# Patient Record
Sex: Female | Born: 1975 | Race: White | Hispanic: No | Marital: Married
Health system: Southern US, Community
[De-identification: ages and names within clinical notes are randomized; demographics above are authoritative.]

## PROBLEM LIST (undated history)

## (undated) DIAGNOSIS — Z87898 Personal history of other specified conditions: Secondary | ICD-10-CM

## (undated) HISTORY — DX: Personal history of other specified conditions: Z87.898

---

## 2005-05-25 ENCOUNTER — Inpatient Hospital Stay: Payer: Self-pay | Admitting: Certified Nurse Midwife

## 2005-05-27 ENCOUNTER — Observation Stay: Payer: Self-pay

## 2005-06-15 ENCOUNTER — Observation Stay: Payer: Self-pay | Admitting: Obstetrics and Gynecology

## 2005-07-04 ENCOUNTER — Observation Stay: Payer: Self-pay | Admitting: Obstetrics and Gynecology

## 2005-07-22 ENCOUNTER — Inpatient Hospital Stay: Payer: Self-pay | Admitting: Obstetrics and Gynecology

## 2006-08-18 ENCOUNTER — Ambulatory Visit: Payer: Self-pay | Admitting: Obstetrics and Gynecology

## 2006-10-19 ENCOUNTER — Observation Stay: Payer: Self-pay | Admitting: Obstetrics and Gynecology

## 2006-11-01 ENCOUNTER — Inpatient Hospital Stay: Payer: Self-pay | Admitting: Obstetrics and Gynecology

## 2008-05-08 ENCOUNTER — Inpatient Hospital Stay: Payer: Self-pay

## 2008-09-24 ENCOUNTER — Emergency Department: Payer: Self-pay | Admitting: Unknown Physician Specialty

## 2009-02-03 ENCOUNTER — Emergency Department: Payer: Self-pay | Admitting: Emergency Medicine

## 2009-02-12 ENCOUNTER — Ambulatory Visit: Payer: Self-pay | Admitting: Obstetrics and Gynecology

## 2009-02-16 ENCOUNTER — Ambulatory Visit: Payer: Self-pay | Admitting: Obstetrics and Gynecology

## 2009-03-09 ENCOUNTER — Ambulatory Visit: Payer: Self-pay | Admitting: Obstetrics and Gynecology

## 2009-03-19 ENCOUNTER — Ambulatory Visit: Payer: Self-pay | Admitting: Obstetrics and Gynecology

## 2009-11-17 ENCOUNTER — Ambulatory Visit: Payer: Self-pay | Admitting: Internal Medicine

## 2010-07-23 ENCOUNTER — Ambulatory Visit: Payer: Self-pay | Admitting: Obstetrics and Gynecology

## 2010-07-26 ENCOUNTER — Ambulatory Visit: Payer: Self-pay | Admitting: Obstetrics and Gynecology

## 2010-07-29 LAB — PATHOLOGY REPORT

## 2010-10-24 IMAGING — US US PELV - US TRANSVAGINAL
1 series · 17 of 25 positions shown · non-contrast
Comparison: none

REASON FOR EXAM: RLQ pain
COMMENTS:   LMP: Post Partum

[Series 1: us pelv - us transvaginal · 17 of 70 slices shown]
[im 1/70]
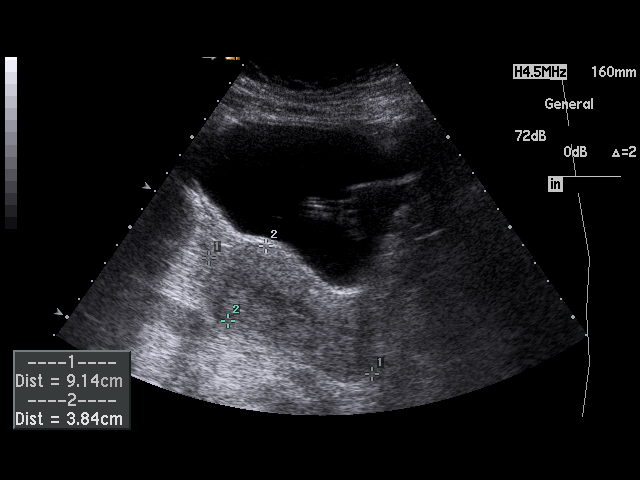
[im 6/70]
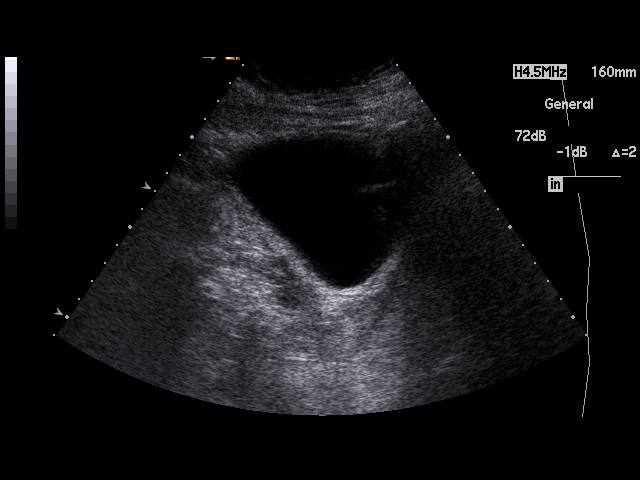
[im 9/70]
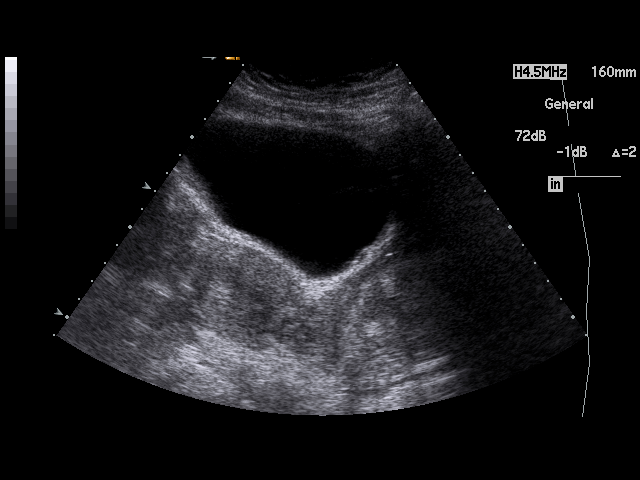
[im 15/70]
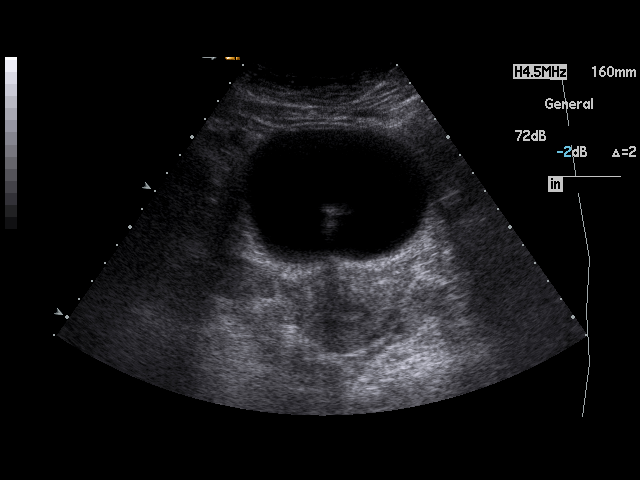
[im 18/70]
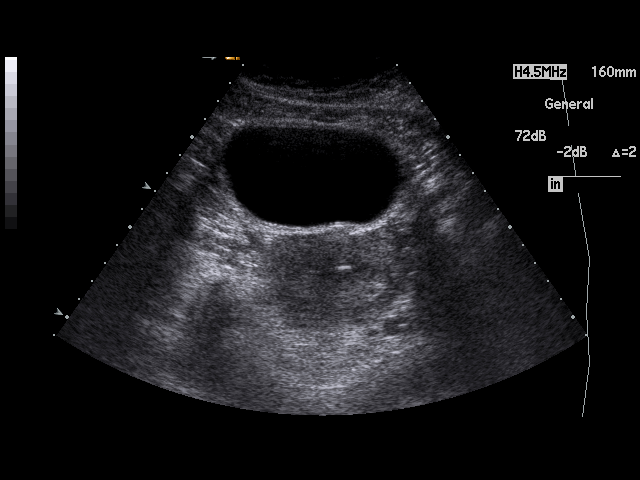
[im 24/70]
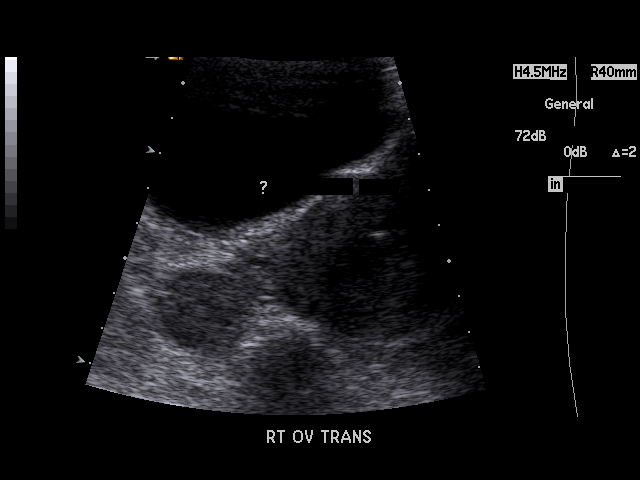
[im 26/70]
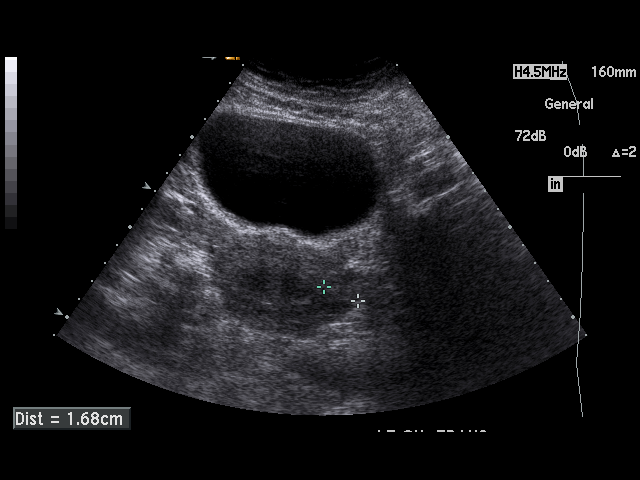
[im 32/70]
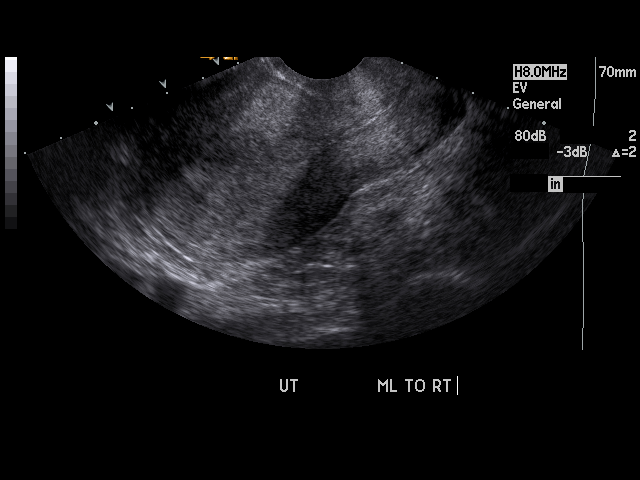
[im 35/70]
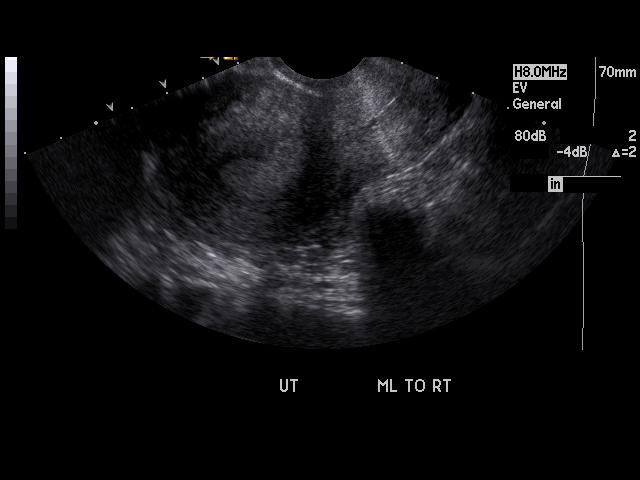
[im 38/70]
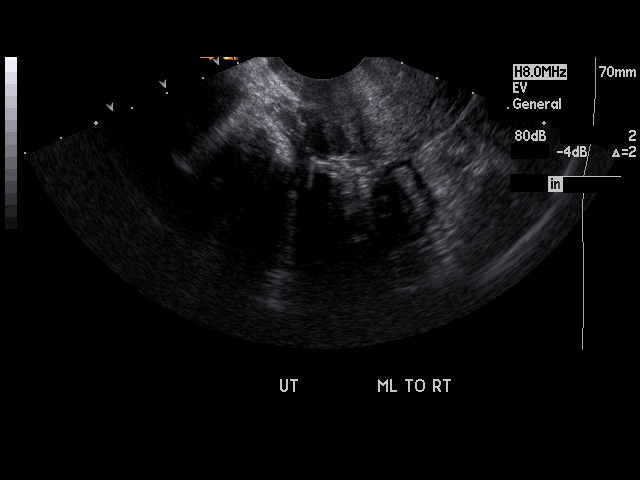
[im 44/70]
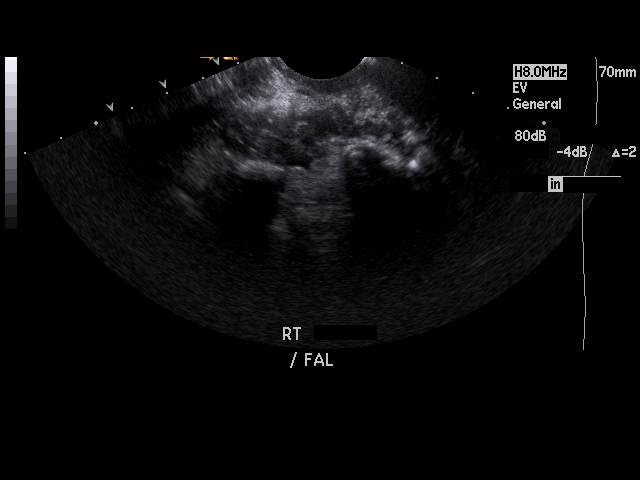
[im 47/70]
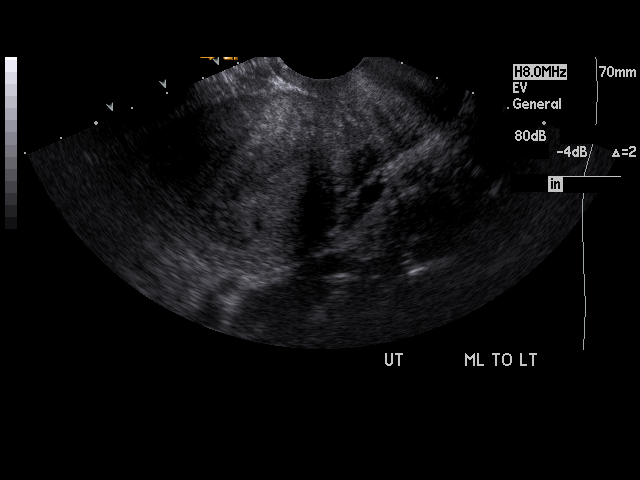
[im 52/70]
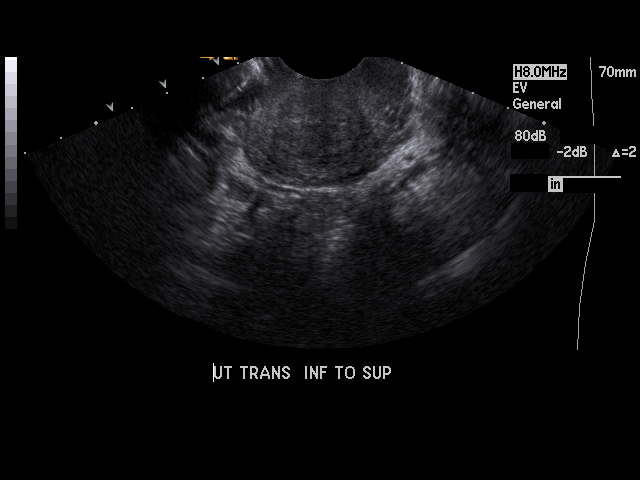
[im 55/70]
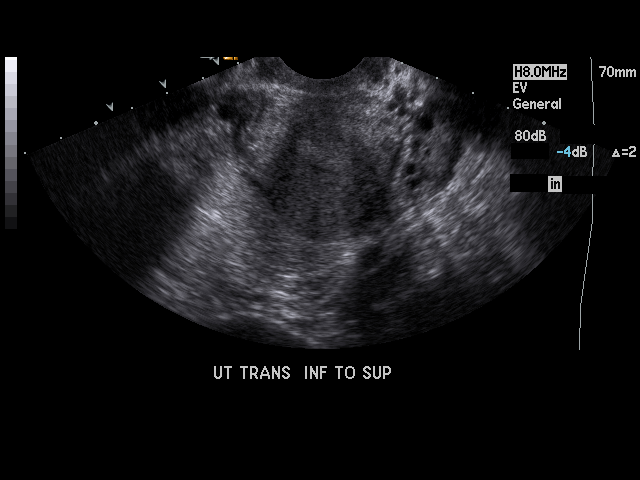
[im 61/70]
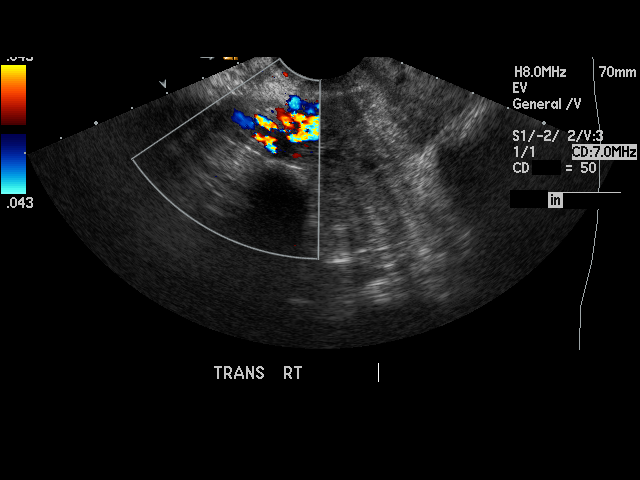
[im 64/70]
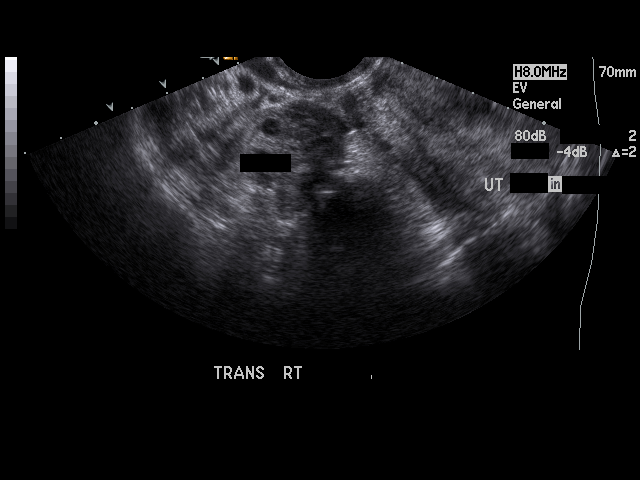
[im 70/70]
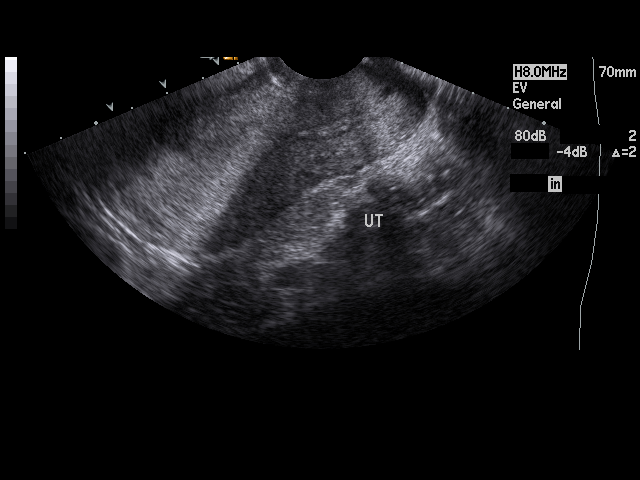

[17 of 25 positions shown; findings below may reference images not displayed]

PROCEDURE:     US  - US PELVIS EXAM W/TRANSVAGINAL  - February 03, 2009  [DATE]

RESULT:     The uterus measures 9.1 x 3.8 x 5.3 cm. In the uterine fundus
adjacent to the endometrium there is an approximately 3 mm diameter anechoic
structure that appears cystic. The endometrial stripe measures 1.4 cm.
 The right ovary measures 3.1 x 2.1 x 1.9 cm. The left ovary measures 2.5 x
1.4 x 1.2 cm. The right fallopian tube is minimally prominent. No discrete
abnormal appearing mass is noted in the right adnexal region to correspond
with the CT findings. There is a small amount of free fluid in the
cul-de-sac.
IMPRESSION: 1. In the right adnexal region there may be minimal prominence of the
fallopian tube. There is no discrete cystic or solid adnexal mass is
demonstrated however. Does the findings seen on the CT scan are felt to be
relatively nonspecific. Correlation with the patient's clinical examination,
beta-hCG, and white blood cell count is recommended. The findings could be
related to an ectopic pregnancy or to a tubo-ovarian abscess or may merely
reflect a mild hydrosalpinx.
2. The endometrial stripe is thickened. Reportedly the patient has not had a
menstrual period since beginning breast-feeding 8 months ago. Reportedly the
patient discontinued breast-feeding one month ago. Again, a beta-hCG
determination is recommended.

## 2010-10-24 IMAGING — CT CT ABD-PELV W/ CM
1 of 2 series · 14 of 32 positions shown, 18 images · non-contrast
Comparison: none

REASON FOR EXAM: (1) RLQ pain; (2) RLQ pain
COMMENTS:   LMP: Post-partum 8 m

[Series 2: appendicitis · axial · 0.64mm/px · z∈[+412,+790]mm · 14 of 144 slices shown, 18 images]
[im 12/144  soft-tissue]
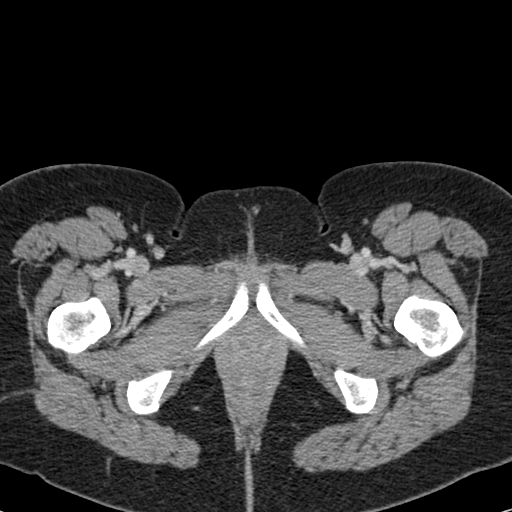
[im 12/144  bone]
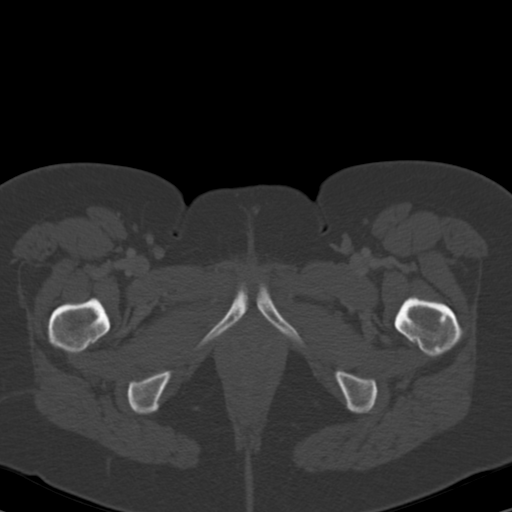
[im 23/144  soft-tissue]
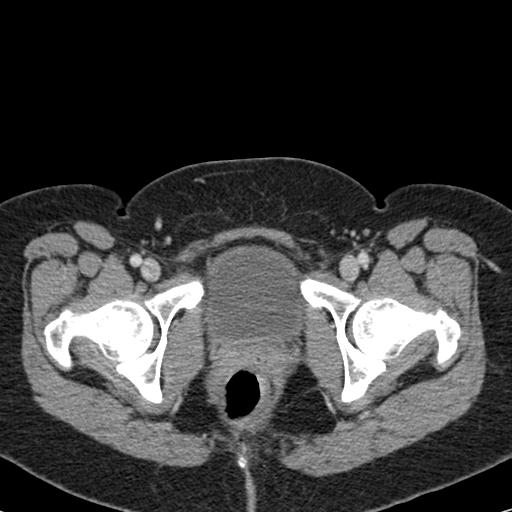
[im 35/144  soft-tissue]
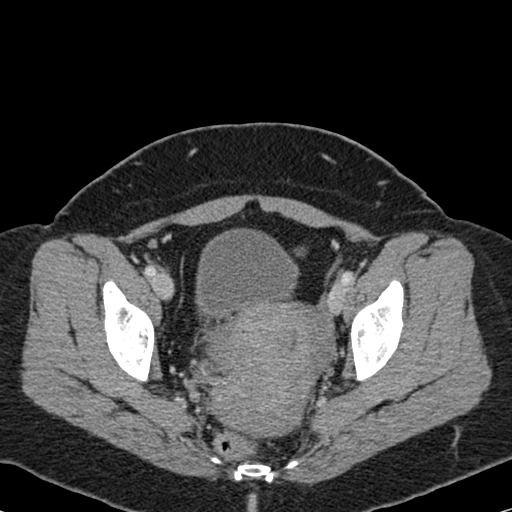
[im 46/144  soft-tissue]
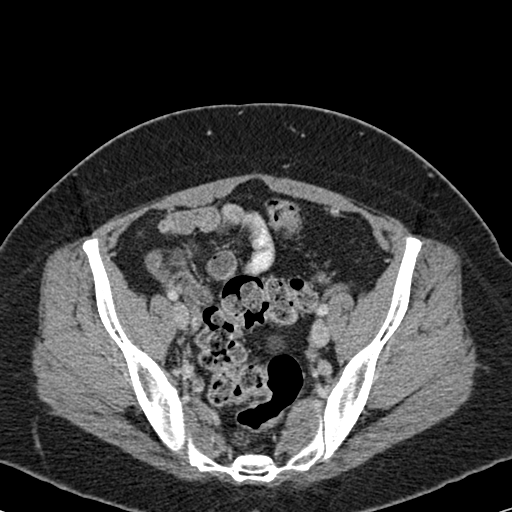
[im 58/144  soft-tissue]
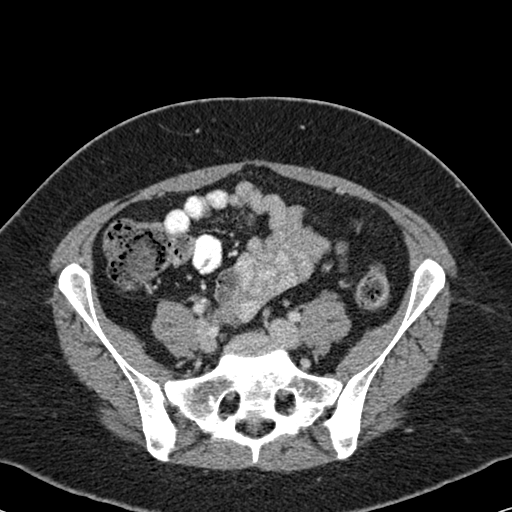
[im 69/144  soft-tissue]
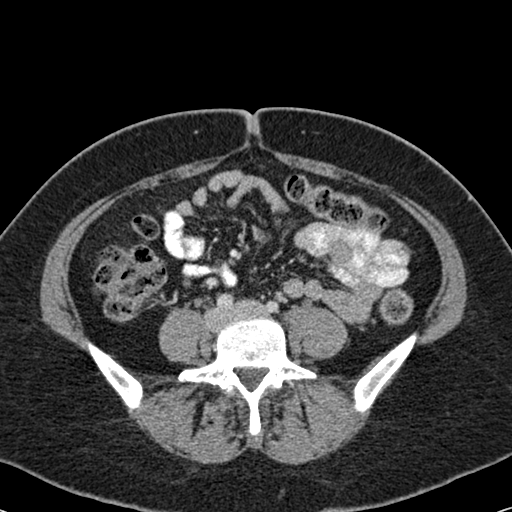
[im 81/144  soft-tissue]
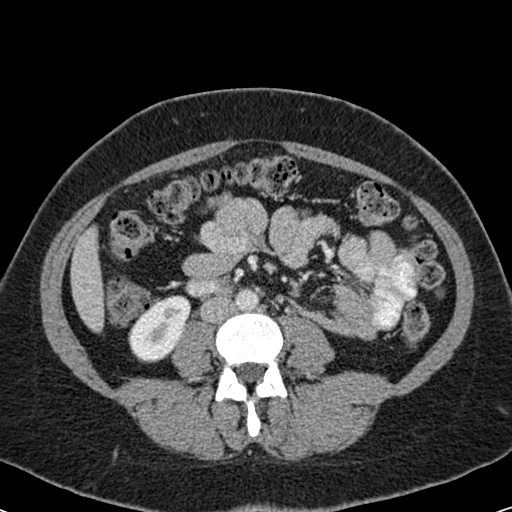
[im 92/144  soft-tissue]
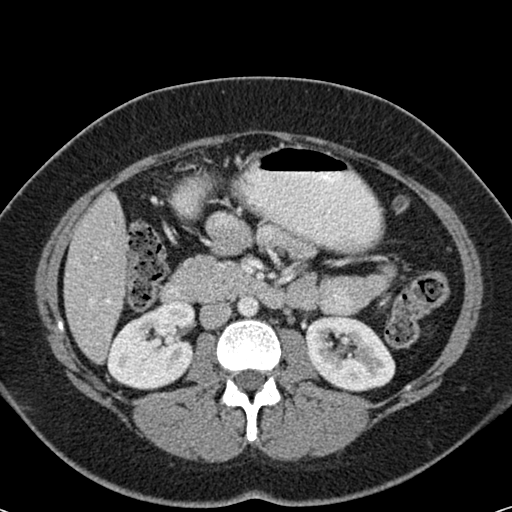
[im 103/144  soft-tissue]
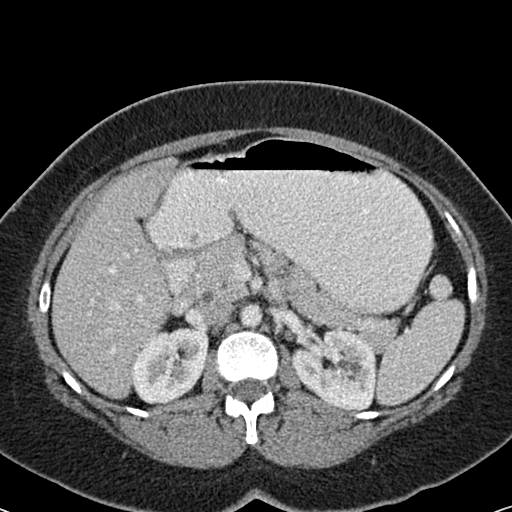
[im 103/144  bone]
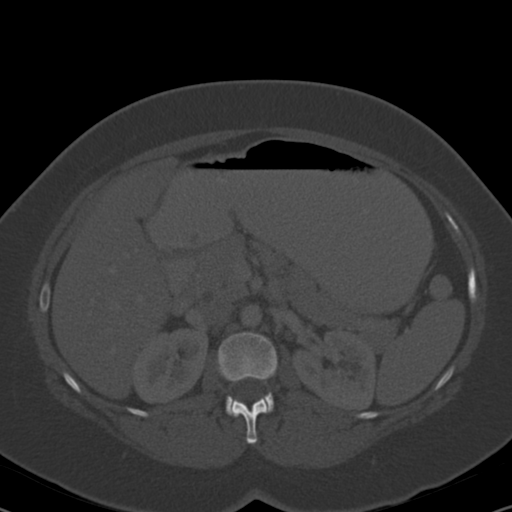
[im 115/144  soft-tissue]
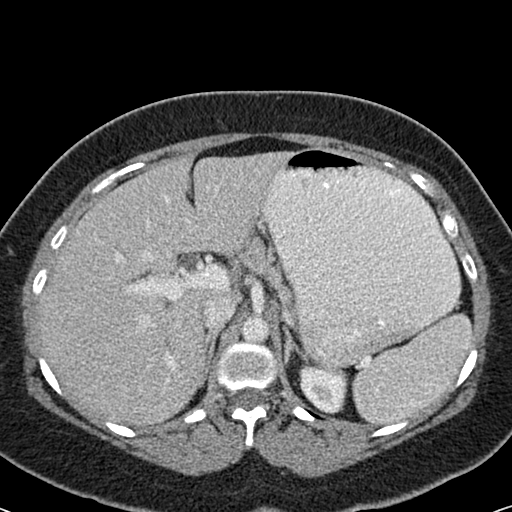
[im 121/144  lung]
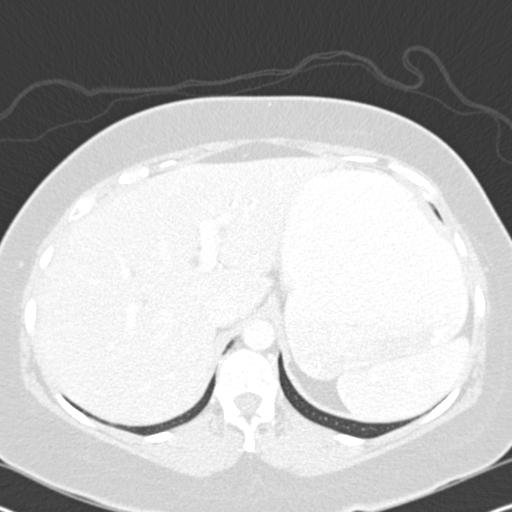
[im 126/144  soft-tissue]
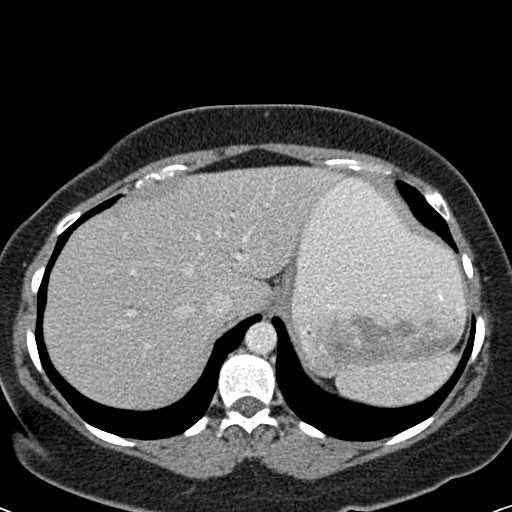
[im 126/144  lung]
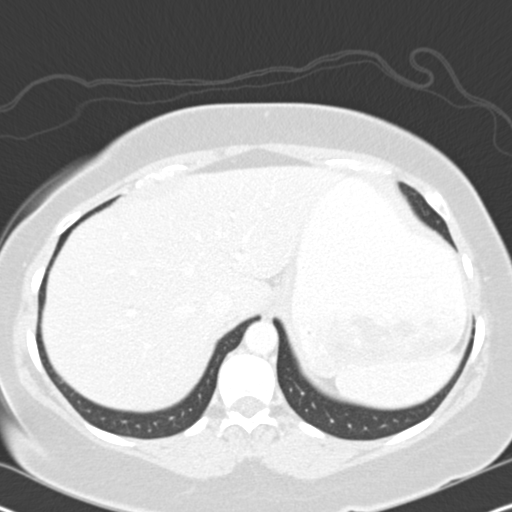
[im 132/144  lung]
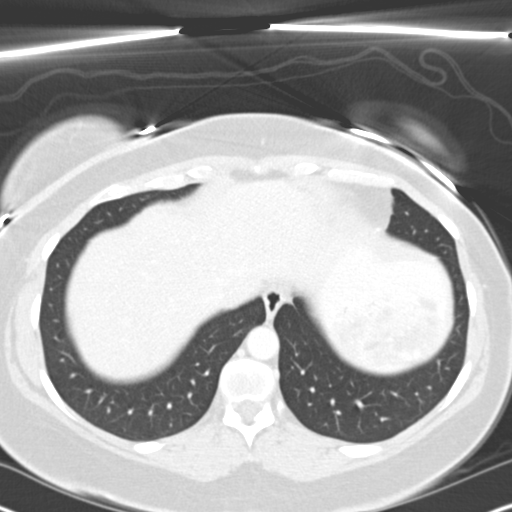
[im 138/144  soft-tissue]
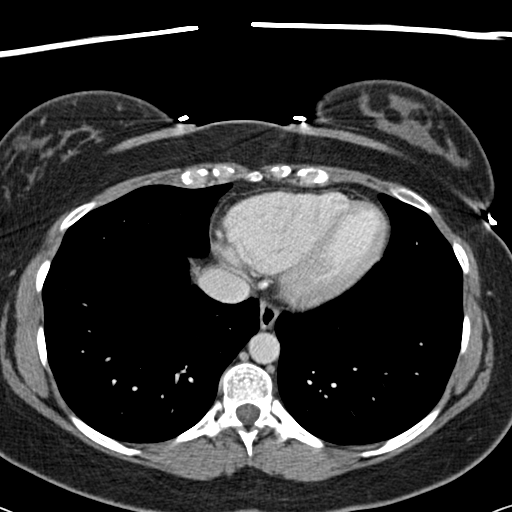
[im 138/144  lung]
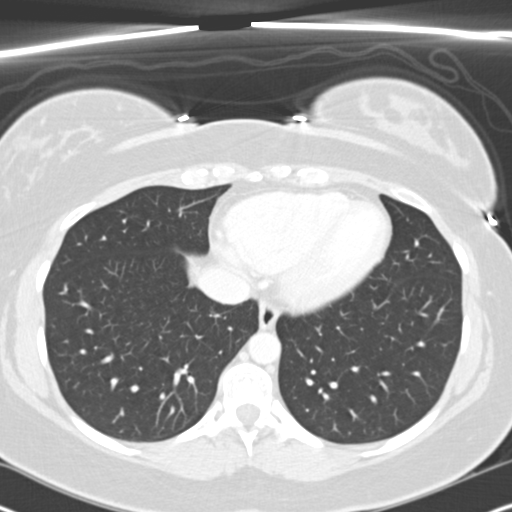

[14 of 32 positions shown; findings below may reference images not displayed]

PROCEDURE:     CT  - CT ABDOMEN / PELVIS  W  - February 03, 2009  [DATE]

RESULT:     Axial CT scanning was performed through the abdomen and pelvis
at 3 mm intervals and slice thicknesses. The patient received oral contrast
as well as 100 cc of [id number] intravenously.

The orally administered contrast is largely in the stomach and upper small
bowel. There is food present within the stomach as well as air. The
gallbladder is surgically absent. There is a small amount of intrahepatic
ductal dilation. The pancreas exhibits no focal mass nor evidence of acute
inflammatory change. The spleen is normal in density and contour. There is
an approximately 1.5 cm diameter accessory spleen. The adrenal glands are
normal in contour. The kidneys exhibit no evidence of obstruction. There is
a nonobstructing approximately 3 mm diameter stone in the midpole of the
right kidney. The perinephric fat is normal in appearance. The caliber of
the abdominal aorta is normal.

The opacification of the small bowel was limited. The small bowel gas
pattern however appears normal. The colonic stool and gas pattern suggests
an element of constipation. There is no evidence of obstruction. I do not
see findings to suggest an acute appendicitis. The appendix is visible on
images 88 through 92 and appears normal. Within the pelvis the uterus
appears normal for age. There is a small amount of fluid and increased soft
tissue density associated with the right ovary and right fallopian tube. The
left ovary appears normal. The partially distended urinary bladder is normal
in appearance. The lung bases are clear. The lumbar vertebral bodies are
preserved in height.
IMPRESSION: 1. I do not see evidence of acute appendicitis nor other acute bowel
abnormality. Evaluation of the bowel is somewhat limited given that the
majority of the patient's oral contrast remains within the stomach. There
are also food particles in the stomach is well.
2. The patient is status post prior cholecystectomy. A small amount of
intrahepatic ductal dilation is present but this is not unusual post
cholecystectomy.
3. There is a nonobstructing stone in the midpole of the right kidney.
4. There is soft tissue fullness in the right adnexal region. This may be
responsible for the patient's symptoms and could reflect cystic ovarian
processes or hydrosalpinx. Correlation with the patient's clinical and
laboratory values including beta-hCG would be useful. Pelvic ultrasound may
be of value.

## 2011-07-31 ENCOUNTER — Encounter: Payer: Self-pay | Admitting: Orthopedic Surgery

## 2011-08-13 ENCOUNTER — Encounter: Payer: Self-pay | Admitting: Orthopedic Surgery

## 2022-11-11 ENCOUNTER — Emergency Department (HOSPITAL_COMMUNITY)
Admission: EM | Admit: 2022-11-11 | Discharge: 2022-11-11 | Disposition: A | Discharge status: Home / Self Care | Payer: 59 | Attending: Emergency Medicine | Admitting: Emergency Medicine

## 2022-11-11 ENCOUNTER — Other Ambulatory Visit: Payer: Self-pay

## 2022-11-11 ENCOUNTER — Emergency Department (HOSPITAL_COMMUNITY): Payer: 59

## 2022-11-11 ENCOUNTER — Encounter (HOSPITAL_COMMUNITY): Payer: Self-pay

## 2022-11-11 DIAGNOSIS — R569 Unspecified convulsions: Secondary | ICD-10-CM | POA: Diagnosis present

## 2022-11-11 DIAGNOSIS — F445 Conversion disorder with seizures or convulsions: Secondary | ICD-10-CM | POA: Diagnosis not present

## 2022-11-11 LAB — CBC WITH DIFFERENTIAL/PLATELET
Abs Immature Granulocytes: 0.01 10*3/uL (ref 0.00–0.07)
Basophils Absolute: 0.1 10*3/uL (ref 0.0–0.1)
Basophils Relative: 1 %
Eosinophils Absolute: 0.1 10*3/uL (ref 0.0–0.5)
Eosinophils Relative: 2 %
HCT: 36.2 % (ref 36.0–46.0)
Hemoglobin: 11.4 g/dL — ABNORMAL LOW (ref 12.0–15.0)
Immature Granulocytes: 0 %
Lymphocytes Relative: 36 %
Lymphs Abs: 1.7 10*3/uL (ref 0.7–4.0)
MCH: 30.1 pg (ref 26.0–34.0)
MCHC: 31.5 g/dL (ref 30.0–36.0)
MCV: 95.5 fL (ref 80.0–100.0)
Monocytes Absolute: 0.4 10*3/uL (ref 0.1–1.0)
Monocytes Relative: 8 %
Neutro Abs: 2.6 10*3/uL (ref 1.7–7.7)
Neutrophils Relative %: 53 %
Platelets: 238 10*3/uL (ref 150–400)
RBC: 3.79 MIL/uL — ABNORMAL LOW (ref 3.87–5.11)
RDW: 12.5 % (ref 11.5–15.5)
WBC: 4.8 10*3/uL (ref 4.0–10.5)
nRBC: 0 % (ref 0.0–0.2)

## 2022-11-11 LAB — COMPREHENSIVE METABOLIC PANEL
ALT: 17 U/L (ref 0–44)
AST: 19 U/L (ref 15–41)
Albumin: 3.2 g/dL — ABNORMAL LOW (ref 3.5–5.0)
Alkaline Phosphatase: 47 U/L (ref 38–126)
Anion gap: 9 (ref 5–15)
BUN: 9 mg/dL (ref 6–20)
CO2: 22 mmol/L (ref 22–32)
Calcium: 7.8 mg/dL — ABNORMAL LOW (ref 8.9–10.3)
Chloride: 109 mmol/L (ref 98–111)
Creatinine, Ser: 0.61 mg/dL (ref 0.44–1.00)
GFR, Estimated: >60 mL/min (ref >60)
Glucose, Bld: 87 mg/dL (ref 70–99)
Potassium: 3.5 mmol/L (ref 3.5–5.1)
Sodium: 140 mmol/L (ref 135–145)
Total Bilirubin: 0.3 mg/dL (ref 0.3–1.2)
Total Protein: 5.5 g/dL — ABNORMAL LOW (ref 6.5–8.1)

## 2022-11-11 LAB — ETHANOL: Alcohol, Ethyl (B): 127 mg/dL — ABNORMAL HIGH (ref <10)

## 2022-11-11 NOTE — ED Provider Notes (Signed)
[place] EMERGENCY DEPARTMENT AT [facility] Provider Note   CSN: [id number] Arrival date & time: 11/11/22  0000     History  Chief Complaint  Patient presents with   Seizures    Michelle Harvey is a 47 y.o. female.  The history is provided by the patient and medical records.  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  Apparently has hx of pseudoseizures, however was recently evaluated by neurology who felt like she may have underlying epilepsy as well.  She is due for follow-up appt soon to see about medication regimen.  Apparently had seizure with EMS en route, resolved after versed and awoke and was talking to paramedics.  Once placed into treatment room she rolled back on right side and stopped talking.  She did not have any further tonic/clonic activity.  I was called to bedside to evaluate at this point.  Per EMS she is currently on lamictal.  Home Medications Prior to Admission medications   Not on File      Allergies    Patient has no allergy information on record.    Review of Systems   Review of Systems  Neurological:  Positive for seizures.  All other systems reviewed and are negative.   Physical Exam Updated Vital Signs BP 109/72   Pulse 75   Temp 97.9 F (36.6 C) (Axillary)   Resp (!) 22   SpO2 94%   Physical Exam Vitals and nursing note reviewed.  Constitutional:      Appearance: She is well-developed.     Comments: Curled up into fetal position on right side-- when rolled onto her back she resists and then rolls back to ride side afterwards  HENT:     Head: Normocephalic and atraumatic.  Eyes:     Conjunctiva/sclera: Conjunctivae normal.     Pupils: Pupils are equal, round, and reactive to light.     Comments: No nystagmus, pupils are symmetric and reactive bilaterally  Cardiovascular:     Rate and Rhythm: Normal rate and regular rhythm.     Heart sounds:  Normal heart sounds.  Pulmonary:     Effort: Pulmonary effort is normal.     Breath sounds: Normal breath sounds.  Abdominal:     General: Bowel sounds are normal.     Palpations: Abdomen is soft.  Musculoskeletal:        General: Normal range of motion.     Cervical back: Normal range of motion.  Skin:    General: Skin is warm and dry.  Neurological:     Comments: Lying in fetal position on right side, staff rolled onto back however she resists and then rolls back onto her right side, she does not answer any questions or follow commands currently    ED Results / Procedures / Treatments   Labs (all labs ordered are listed, but only abnormal results are displayed) Labs Reviewed  CBC WITH DIFFERENTIAL/PLATELET - Abnormal; Notable for the following components:      Result Value   RBC 3.79 (*)    Hemoglobin 11.4 (*)    All other components within normal limits  COMPREHENSIVE METABOLIC PANEL - Abnormal; Notable for the following components:   Calcium 7.8 (*)    Total Protein 5.5 (*)    Albumin 3.2 (*)    All other components within normal limits  ETHANOL - Abnormal; Notable for the following components:   Alcohol, Ethyl (B) 127 (*)  All other components within normal limits  RAPID URINE DRUG SCREEN, HOSP PERFORMED  LAMOTRIGINE LEVEL    EKG None  Radiology CT HEAD WO CONTRAST ( )  Result Date: 11/11/2022 CLINICAL DATA:  Seizure EXAM: CT HEAD WITHOUT CONTRAST TECHNIQUE: Contiguous axial images were obtained from the base of the skull through the vertex without intravenous contrast. RADIATION DOSE REDUCTION: This exam was performed according to the departmental dose-optimization program which includes automated exposure control, adjustment of the mA and/or kV according to patient size and/or use of iterative reconstruction technique. COMPARISON:  None Available. FINDINGS: Brain: No evidence of acute infarction, hemorrhage, hydrocephalus, extra-axial collection or mass  lesion/mass effect. Vascular: No hyperdense vessel or unexpected calcification. Skull: Normal. Negative for fracture or focal lesion. Sinuses/Orbits: Patchy mucosal thickening in the anterior ethmoid and right frontal sinus Other: None IMPRESSION: 1. Negative non contrasted CT appearance of the brain. 2. Mild paranasal sinus disease. Electronically Signed   By: Jasmine Pang M.D.   On: 11/11/2022 00:47    Procedures Procedures    Medications Ordered in ED Medications - No data to display  ED Course/ Medical Decision Making/ A&P                             Medical Decision Making Amount and/or Complexity of Data Reviewed Labs: ordered. Radiology: ordered and independent interpretation performed. ECG/medicine tests: ordered and independent interpretation performed.   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  I was called into room shortly after arrival for possible seizure activity, however patient is curled up into fetal position on her right side.  When she is moved onto her back she resist and then rolls back over to her right side.  She has normal vitals on room air.  She does not have any nystagmus.  She does not have any evidence of tongue biting or other oral trauma.  No incontinence.  This seems consistent with pseudoseizure.  Will obtain workup including CT head and labs.  1:03 AM Called back into room for possible seizure activity-- patient has intermittent "twitching" of her arms that actually seems quite purposeful, but remains without any true tonic/clonic activity.  She remains without any nystagmus, jaw clenching.  VS are stable on RA without tachycardia or hypoxia.  When changed from one position she turns herself back onto her side as she was doing previously.  This does not seem consistent with true seizure activity.  CT head negative.  Awaiting laboratory studies.   2:35 AM Patient has woken up now and states "I  have to pee".  Daughter reports this usually happens when she is coming out of her "episodes".  I have discussed lab results that she does have alcohol in her system, ethanol 127 today.  Daughter reports she was talking to her dad after dinner and patient seemed a little drowsy.  He questioned how many anxiety tablets she took and she reported "4".  I does look like she is prescribed lamictal, effexor, and trazodone.  Unclear exactly which pills she took 4 of.  This may be contributing to her symptoms today.  Will monitor until fully awake/alert.  4:24 AM Patient has voiced that is she ready to go.  States she has walked to the bathroom and feels fine currently.  Seems reasonable to discharge.  Instructed to take medications only as prescribed, not to mix with alcohol.  She can follow-up with her  doctor once she returns home to [place].  Can return here for new concerns.  Final Clinical Impression(s) / ED Diagnoses Final diagnoses:  Psychogenic nonepileptic seizure    Rx / DC Orders ED Discharge Orders     None         Garlon Hatchet, PA-C 11/11/22 0545    Glynn Octave, MD 11/11/22 [phone]

## 2022-11-11 NOTE — Discharge Instructions (Signed)
Take your medications only as prescribed.  Do not mix your medications with alcohol. Follow-up with your doctor once you return home to [place].

## 2022-11-11 NOTE — ED Triage Notes (Signed)
Pt arrives via EMS after daughter called for pt having seizure activity. Per pt and her daughter pt has history of pseudoseizures and also possible epileptic seizures. Initially on arrival to ER pt A/Ox4, and ambulated from the room to the restroom, then had to be taken back to room in wheelchair. Once back in room, EMS requesting this nurse d/t pt exhibiting seizure activity. Once in room, provider requested for rapid evaluation, pt observed by this nurse on her right side curled up into a ball which EMS reports was the witnessed seizure activity at the pt's home and en route. Pt not responsive at this time verbally.
